# Patient Record
Sex: Male | Born: 2000 | Race: White | Hispanic: No | Marital: Single | Smoking: Never smoker
Health system: Southern US, Community
[De-identification: ages and names within clinical notes are randomized; demographics above are authoritative.]

---
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
## 2022-12-19 ENCOUNTER — Encounter: Payer: BLUE CROSS/BLUE SHIELD | Admitting: Adult Health

## 2022-12-19 ENCOUNTER — Encounter: Payer: Self-pay | Admitting: Adult Health

## 2022-12-19 NOTE — Progress Notes (Signed)
This encounter was created in error - please disregard.

## 2023-02-10 NOTE — Progress Notes (Signed)
This encounter was created in error - please disregard.
